# Patient Record
Sex: Female | Born: 1950 | Race: White | Hispanic: No | Marital: Married | Smoking: Never smoker
Health system: Southern US, Community
[De-identification: ages and names within clinical notes are randomized; demographics above are authoritative.]

## PROBLEM LIST (undated history)

## (undated) DIAGNOSIS — G2581 Restless legs syndrome: Secondary | ICD-10-CM

## (undated) DIAGNOSIS — K219 Gastro-esophageal reflux disease without esophagitis: Secondary | ICD-10-CM

## (undated) DIAGNOSIS — G43909 Migraine, unspecified, not intractable, without status migrainosus: Secondary | ICD-10-CM

## (undated) HISTORY — PX: ABDOMINOPLASTY: SUR9

## (undated) HISTORY — DX: Migraine, unspecified, not intractable, without status migrainosus: G43.909

## (undated) HISTORY — DX: Gastro-esophageal reflux disease without esophagitis: K21.9

## (undated) HISTORY — PX: CATARACT EXTRACTION W/ INTRAOCULAR LENS  IMPLANT, BILATERAL: SHX1307

## (undated) HISTORY — PX: KIDNEY STONE SURGERY: SHX686

## (undated) HISTORY — DX: Restless legs syndrome: G25.81

---

## 1998-10-23 ENCOUNTER — Other Ambulatory Visit: Admission: RE | Admit: 1998-10-23 | Discharge: 1998-10-23 | Payer: Self-pay | Admitting: Gynecology

## 2000-02-09 ENCOUNTER — Other Ambulatory Visit: Admission: RE | Admit: 2000-02-09 | Discharge: 2000-02-09 | Payer: Self-pay | Admitting: Gynecology

## 2007-08-20 HISTORY — PX: VAGINAL HYSTERECTOMY: SUR661

## 2007-09-01 ENCOUNTER — Ambulatory Visit (HOSPITAL_COMMUNITY): Admission: RE | Admit: 2007-09-01 | Discharge: 2007-09-02 | Payer: Self-pay | Admitting: Obstetrics and Gynecology

## 2007-09-01 ENCOUNTER — Encounter (INDEPENDENT_AMBULATORY_CARE_PROVIDER_SITE_OTHER): Payer: Self-pay | Admitting: Obstetrics and Gynecology

## 2007-11-17 HISTORY — PX: AUGMENTATION MAMMAPLASTY: SUR837

## 2008-07-02 ENCOUNTER — Encounter: Admission: RE | Admit: 2008-07-02 | Discharge: 2008-07-02 | Payer: Self-pay | Admitting: Orthopedic Surgery

## 2008-07-17 ENCOUNTER — Encounter: Admission: RE | Admit: 2008-07-17 | Discharge: 2008-07-17 | Payer: Self-pay | Admitting: Orthopedic Surgery

## 2008-08-01 ENCOUNTER — Encounter: Admission: RE | Admit: 2008-08-01 | Discharge: 2008-08-01 | Payer: Self-pay | Admitting: Orthopedic Surgery

## 2010-08-10 ENCOUNTER — Encounter: Payer: Self-pay | Admitting: Obstetrics & Gynecology

## 2010-12-01 NOTE — Discharge Summary (Signed)
[name], [name]               ACCOUNT NO.:  [id number]   MEDICAL RECORD NO.:  [id number]          PATIENT TYPE:  OIB   LOCATION:  9308                          FACILITY:  [facility]   PHYSICIAN:  Duke Salvia. Marcelle Overlie, M.D.DATE OF BIRTH:  09-26-1950   DATE OF ADMISSION:  09/01/2007  DATE OF DISCHARGE:  09/02/2007                               DISCHARGE SUMMARY   DISCHARGE DIAGNOSES:  1. Symptomatic uterine prolapse with cystocele, rectocele.  2. TVH/A&P repair this admission.   HISTORY:  For history, summary, physical exam, please see admission  history for details.  Briefly, a 60 year old with symptomatic uterine  prolapse, cystocele, rectocele presents for surgical correction.   HOSPITAL COURSE:  On February 13, under general anesthesia, the patient  underwent TVH/A&P repair.  On first postop day, she was voiding without  difficulty.  Vaginal packing had been removed.  She was tolerating a  regular diet, was afebrile, was ready for discharge at that point.   LABORATORY DATA:  Preoperative CBC:  WBC 6.2, hemoglobin 13.5,  hematocrit 38.5.  On February 14, WBC 7.3, hemoglobin 10.1, hematocrit  28.3.  blood type is A negative, antibody screen negative.   DISPOSITION:  The patient discharged on Tylox p.r.n. pain.  May resume  her other routine medications.  Return to the office in one week.  Advised to report any incisional redness or drainage, increased pain or  bleeding or fever over 101.  She was given specific instructions  regarding diet, sex, exercise.   CONDITION:  Good.   ACTIVITY:  Graded increase.      Richard M. Marcelle Overlie, M.D.  Electronically Signed     RMH/MEDQ  D:  09/02/2007  T:  09/04/2007  Job:  [id number]

## 2010-12-01 NOTE — H&P (Signed)
NAMEPAULLA, [name]               ACCOUNT NO.:  [id number]   MEDICAL RECORD NO.:  [id number]          PATIENT TYPE:  AMB   LOCATION:  SDC                           [facility]  [facility]   PHYSICIAN:  Duke Salvia. Marcelle Overlie, M.D.DATE OF BIRTH:  1951-02-06   DATE OF ADMISSION:  DATE OF DISCHARGE:                              HISTORY & PHYSICAL   CHIEF COMPLAINT:  Symptomatic pelvic prolapse.   HISTORY OF PRESENT ILLNESS:  A 60 year old G3, P2, A1 I saw originally  in May of 2008 after being taken care of by Dr. Asencion Islam for years.  At  that time, she was complaining of increased pelvic pressure, and exam  showed mild uterine prolapse with a moderate cystocele and rectocele.  She has been on HRT consisting of estradiol and progesterone compounded  on a daily basis.  Exam later in 2008, she was complaining of  dyspareunia with increased pelvic pressure, and we have discussed in  detail [facility] A&P repair.  She would prefer to conserve her otherwise  normal ovaries.  This procedure, including risks of bleeding, infection,  adjacent organ injury, possible need for open additional surgery all  reviewed with her which she understands and accepts.  Other risks  related to phlebitis, possible transfusion were discussed also.   ALLERGIES:  SULFA.   CURRENT MEDICATIONS:  Midrin, Cymbalta, Mirapex, Topamax, Flexeril,  Nexium.   PAST SURGICAL HISTORY:  1. Cesarean section x2.  2. Abdominoplasty in 1993.   FAMILY HISTORY:  Significant for father with type 2 diabetes and a  history of stroke.   PHYSICAL EXAMINATION:  VITAL SIGNS:  Temperature 98.2, blood pressure  120/72.  HEENT:  Unremarkable.  NECK:  Supple without masses.  LUNGS:  Clear.  CARDIOVASCULAR:  Regular rate and rhythm without murmurs, rubs, or  gallops.  BREASTS:  Without masses.  ABDOMEN:  Soft, flat, nontender.  PELVIC:  Normal external genitalia.  Vagina and cervix were clear.  Uterus was normal size, mobile.  Grade 1 prolapse with a  grade 2  cystocele and rectocele.  Bimanual was otherwise unremarkable.  EXTREMITIES:  Unremarkable.  NEUROLOGIC:  Unremarkable.   IMPRESSION:  Symptomatic uterine prolapse with cystocele, rectocele.   PLAN:  TVH, A&P repair.  Procedure and risks reviewed as above.      Richard M. Marcelle Overlie, M.D.  Electronically Signed     RMH/MEDQ  D:  08/12/2007  T:  08/13/2007  Job:  [id number]

## 2010-12-01 NOTE — Op Note (Signed)
[name], [name]               ACCOUNT NO.:  [id number]   MEDICAL RECORD NO.:  [id number]          PATIENT TYPE:  AMB   LOCATION:  SDC                           [facility]   PHYSICIAN:  Duke Salvia. Marcelle Overlie, M.D.DATE OF BIRTH:  Dec 11, 1950   DATE OF PROCEDURE:  09/01/2007  DATE OF DISCHARGE:                               OPERATIVE REPORT   PREOPERATIVE DIAGNOSES:  1. Symptomatic uterine prolapse.  2. Cystocele and rectocele.   POSTOPERATIVE DIAGNOSES:  1. Symptomatic uterine prolapse.  2. Cystocele and rectocele.   PROCEDURE:  1. Total vaginal hysterectomy.  2. Anterior and posterior repair.   SURGEON:  Duke Salvia. Marcelle Overlie, M.D.   ASSISTANT:  Guy Sandifer. Henderson Cloud, M.D.   ANESTHESIA:  General endotracheal.   COMPLICATIONS:  None.   DRAINS:  Foley catheter.   BLOOD LOSS:  300 mL.   SPECIMENS REMOVED:  Uterus.   PROCEDURE AND FINDINGS:  The patient taken to the operating room.  After  an adequate level of level of general endotracheal anesthesia was  obtained, the patient legs were placed in stirrups.  The perineum and  vagina were prepped and draped in the usual manner for vaginal  procedures.  Catheter was positioned, draining clear urine.  EUA carried  out.  Uterus was normal size, mobile, adnexa negative.  Speculum was  positioned.  Cervix grasped with a tenaculum.  The cervical vaginal  mucosa was incised and a posterior colpotomy performed without  difficulty.  The uterosacral ligaments on each side were then clamped,  divided, and suture ligated in Heaney fashion with #0 Vicryl suture and  held temporarily.   The mucosa was advanced superiorly.  In sequential manner, staying close  to the uterus, the cardinal ligament, uterine vasculature and upper  broad ligament pedicles were clamped with the LigaSure, coagulated, and  divided with excellent hemostasis.  This was done assuring that the  bladder was advanced to well above.  The peritoneal reflection,  anteriorly, was identified and entered sharply; and a retractor used to  gently elevate the bladder out of the field.  The remaining upper broad  ligament pedicles were coagulated and divided.  The fundus of the uterus  was then delivered posteriorly.  The remaining pedicle was clamped,  divided, and free-tied with #0 Vicryl suture.   Both ovaries were inspected and noted to be very thin and small in  appearance.  They were left in place at the patient's request.  The cuff  was then closed from 3-to-9 o'clock with a running 2-0 Vicryl suture.  This was locked.  Prior to closure sponge, needle, and instrument counts  were reported as correct x2.  The mucosa then closed right-to-left with  interrupted 2-0 Monocryl sutures.  Anterior repair was started.  At that  point, the vaginal mucosa was divided in the midline from just above the  cuff to the UV angle.  The paravesical tissue was then separated with  sharp and blunt dissection.  The paravesical connective tissue was then  plicated in the midline.  Excess vaginal mucosa was trimmed, and then  the mucosa reapproximated  with 2-0 Vicryl running sutures.  Posteriorly  a small wedge of tissue was removed from the perineum.   The posterior peritoneum was then dissected in the midline,  approximately halfway up the posterior wall.  Sharp-and-blunt dissection  was then used to free up the perirectal fascia, reducing the rectocele.  The perirectal fascia was then plicated in the midline.  A small amount  of excess vaginal mucosa was trimmed, and then reapproximated with 2-0  Vicryl sutures.  Then 3-0 Vicryl Rapide used on the perineum.  A 1-inch  taped sponge with Esterase was then used to pack the vagina although the  operative site was hemostatic at that point.  Clear urine noted  throughout.  She did receive Toradol at that point, and went to the PACU  in good condition.      Richard M. Marcelle Overlie, M.D.  Electronically Signed      RMH/MEDQ  D:  09/01/2007  T:  09/02/2007  Job:  [phone]

## 2011-04-09 LAB — CBC
HCT: 28.3 — ABNORMAL LOW
MCHC: 35.2
MCHC: 35.7
MCV: 87.4
MCV: 87.8
Platelets: 210
RDW: 12
WBC: 7.3

## 2011-04-09 LAB — TYPE AND SCREEN
ABO/RH(D): A NEG
Antibody Screen: NEGATIVE

## 2012-03-24 DIAGNOSIS — G2581 Restless legs syndrome: Secondary | ICD-10-CM | POA: Insufficient documentation

## 2012-03-24 DIAGNOSIS — G43909 Migraine, unspecified, not intractable, without status migrainosus: Secondary | ICD-10-CM | POA: Insufficient documentation

## 2012-12-04 ENCOUNTER — Other Ambulatory Visit: Payer: Self-pay | Admitting: Nurse Practitioner

## 2012-12-04 NOTE — Telephone Encounter (Signed)
eScribe request for refill on OSPHENA Last filled - samples given 08/08/12 Last AEX - 06/20/12 Next AEX - 06/22/13 Pt advised in phone note in paper chart to follow up with office in 6-8 weeks.  Please advise refills.  Chart on your shelf.  Thanks.

## 2012-12-04 NOTE — Telephone Encounter (Signed)
OK to refill if she feels this is helping - if she felt no better then I was going to recheck her. OK to refill Osphena 60 mg daily until next AEX.

## 2013-05-15 ENCOUNTER — Other Ambulatory Visit: Payer: Self-pay | Admitting: Nurse Practitioner

## 2013-05-15 NOTE — Telephone Encounter (Signed)
Annual Exam scheduled with Ulice Bold 06/22/13

## 2013-06-22 ENCOUNTER — Ambulatory Visit: Payer: Self-pay | Admitting: Nurse Practitioner

## 2013-09-25 ENCOUNTER — Encounter: Payer: Self-pay | Admitting: Nurse Practitioner

## 2013-09-26 ENCOUNTER — Ambulatory Visit (INDEPENDENT_AMBULATORY_CARE_PROVIDER_SITE_OTHER): Payer: BC Managed Care – PPO | Admitting: Nurse Practitioner

## 2013-09-26 ENCOUNTER — Encounter: Payer: Self-pay | Admitting: Nurse Practitioner

## 2013-09-26 VITALS — BP 100/62 | HR 72 | Ht 61.0 in | Wt 133.0 lb

## 2013-09-26 DIAGNOSIS — G43009 Migraine without aura, not intractable, without status migrainosus: Secondary | ICD-10-CM | POA: Insufficient documentation

## 2013-09-26 DIAGNOSIS — G2581 Restless legs syndrome: Secondary | ICD-10-CM

## 2013-09-26 DIAGNOSIS — N952 Postmenopausal atrophic vaginitis: Secondary | ICD-10-CM

## 2013-09-26 DIAGNOSIS — K219 Gastro-esophageal reflux disease without esophagitis: Secondary | ICD-10-CM

## 2013-09-26 DIAGNOSIS — Z01419 Encounter for gynecological examination (general) (routine) without abnormal findings: Secondary | ICD-10-CM

## 2013-09-26 MED ORDER — OSPEMIFENE 60 MG PO TABS: ORAL_TABLET | ORAL | Status: DC

## 2013-09-26 NOTE — Patient Instructions (Signed)

## 2013-09-26 NOTE — Progress Notes (Signed)
Patient ID: [name], female   DOB: 1950-10-27, 63 y.o.   MRN: [id number] 63 y.o. [id number] Married Caucasian Fe here for annual exam.  Married for 30 yrs.  Some vaso symptoms. Sleep is OK.   Using Osphena with good relief of vaginal dryness.  Patient's last menstrual period was 07/20/2007.          Sexually active: yes  The current method of family planning is status post hysterectomy.    Exercising: no  The patient does not participate in regular exercise at present. Smoker:  no  Health Maintenance: Pap:  06/20/12, WNL MMG:  12/25/12, Bi-Rads 1: negative Colonoscopy:  2004, repeat in 10 years due and will schedule with PCP BMD:   2011 normal TDaP:  10/14/09, PCP Labs: PCP   reports that she has never smoked. She has never used smokeless tobacco. She reports that she drinks alcohol. She reports that she does not use illicit drugs.  Past Medical History  Diagnosis Date  . GERD (gastroesophageal reflux disease)   . Migraines   . Restless leg syndrome     Past Surgical History  Procedure Laterality Date  . Cesarean section  1986, 1989    2  . Augmentation mammaplasty  11/2007  . Kidney stone surgery Bilateral 04/11, 10/13    Renal Calculi w/ Booket w/ Stent, Renal Calculi w/ Stent  . Rectocele repair      Current Outpatient Prescriptions  Medication Sig Dispense Refill  . Ascorbic Acid (VITAMIN C PO) Take by mouth.      . Calcium Carb-Cholecalciferol (CALCIUM 1000 + D PO) Take by mouth.      . eletriptan (RELPAX) 40 MG tablet Take 40 mg by mouth as needed for migraine or headache. One tablet by mouth at onset of headache. May repeat in 2 hours if headache persists or recurs.      . Multiple Vitamin (MULTIVITAMIN) tablet Take 1 tablet by mouth daily.      . OSPHENA 60 MG TABS take 1 tablet by mouth once daily  30 tablet  1  . pramipexole (MIRAPEX) 0.5 MG tablet Take 0.5 mg by mouth daily.        No current facility-administered medications for this visit.    Family History   Problem Relation Age of Onset  . Diabetes Mother   . Depression Mother   . Diabetes Father   . Hypertension Father   . Stroke Father   . Breast cancer Paternal Grandmother     ROS:  Pertinent items are noted in HPI.  Otherwise, a comprehensive ROS was negative.  Exam:   BP 100/62  Pulse 72  Ht 5\' 1"  (1.549 m)  Wt 133 lb (60.328 kg)  BMI 25.14 kg/m2  LMP 07/20/2007 Height: 5\' 1"  (154.9 cm)  Ht Readings from Last 3 Encounters:  09/26/13 5\' 1"  (1.549 m)    General appearance: alert, cooperative and appears stated age Head: Normocephalic, without obvious abnormality, atraumatic Neck: no adenopathy, supple, symmetrical, trachea midline and thyroid normal to inspection and palpation Lungs: clear to auscultation bilaterally Breasts: normal appearance, no masses or tenderness, positive findings: implants bilaterally Heart: regular rate and rhythm Abdomen: soft, non-tender; no masses,  no organomegaly Extremities: extremities normal, atraumatic, no cyanosis or edema Skin: Skin color, texture, turgor normal. No rashes or lesions Lymph nodes: Cervical, supraclavicular, and axillary nodes normal. No abnormal inguinal nodes palpated Neurologic: Grossly normal   Pelvic: External genitalia:  no lesions  Urethra:  normal appearing urethra with no masses, tenderness or lesions              Bartholin's and Skene's: normal                 Vagina: normal appearing vagina with normal color and discharge, no lesions              Cervix: absent              Pap taken: no Bimanual Exam:  Uterus:  uterus absent              Adnexa: no mass, fullness, tenderness               Rectovaginal: Confirms               Anus:  normal sphincter tone, no lesions  A:  Well Woman with normal exam  S/P TVH with A & P repair 08/2007  Atrophic vaginitis on Osphenia  History of GERD, RLS, Migraines  P:   Pap smear as per guidelines   Mammogram due 12/2013  Refill Osphenia for a year  She  will schedule colonoscopy with PCP  Counseled on breast self exam, mammography screening, adequate intake of calcium and vitamin D, diet and exercise return annually or prn  An After Visit Summary was printed and given to the patient.

## 2013-10-02 NOTE — Progress Notes (Signed)
Encounter reviewed by Dr. Lamberto Dinapoli Silva.  

## 2014-05-20 ENCOUNTER — Encounter: Payer: Self-pay | Admitting: Nurse Practitioner

## 2014-10-01 ENCOUNTER — Ambulatory Visit: Payer: Self-pay | Admitting: Nurse Practitioner

## 2014-10-10 ENCOUNTER — Ambulatory Visit: Payer: Self-pay | Admitting: Nurse Practitioner

## 2015-02-27 ENCOUNTER — Other Ambulatory Visit: Payer: Self-pay | Admitting: Obstetrics & Gynecology

## 2015-02-28 LAB — CYTOLOGY - PAP

## 2020-02-01 DIAGNOSIS — R079 Chest pain, unspecified: Secondary | ICD-10-CM | POA: Insufficient documentation

## 2020-02-01 DIAGNOSIS — R000 Tachycardia, unspecified: Secondary | ICD-10-CM | POA: Insufficient documentation

## 2020-02-01 DIAGNOSIS — R5383 Other fatigue: Secondary | ICD-10-CM | POA: Insufficient documentation

## 2020-02-01 DIAGNOSIS — I959 Hypotension, unspecified: Secondary | ICD-10-CM | POA: Insufficient documentation

## 2020-07-04 DIAGNOSIS — H43813 Vitreous degeneration, bilateral: Secondary | ICD-10-CM | POA: Insufficient documentation

## 2020-07-04 DIAGNOSIS — H52203 Unspecified astigmatism, bilateral: Secondary | ICD-10-CM | POA: Insufficient documentation

## 2020-07-04 DIAGNOSIS — H04123 Dry eye syndrome of bilateral lacrimal glands: Secondary | ICD-10-CM | POA: Insufficient documentation

## 2020-07-04 DIAGNOSIS — H02831 Dermatochalasis of right upper eyelid: Secondary | ICD-10-CM | POA: Insufficient documentation

## 2020-07-04 DIAGNOSIS — H11153 Pinguecula, bilateral: Secondary | ICD-10-CM | POA: Insufficient documentation

## 2023-11-17 ENCOUNTER — Ambulatory Visit: Admitting: Neurology

## 2023-11-17 ENCOUNTER — Encounter: Payer: Self-pay | Admitting: Neurology

## 2023-11-17 VITALS — BP 158/82 | HR 88 | Ht 61.5 in | Wt 144.0 lb

## 2023-11-17 DIAGNOSIS — G459 Transient cerebral ischemic attack, unspecified: Secondary | ICD-10-CM

## 2023-11-17 NOTE — Progress Notes (Signed)
 Chief Complaint  Patient presents with   New Patient (Initial Visit)    Pt in 14, here alone  Pt is referred for TIA. Pt states she has questions about the plaque that showed up on her MRI.       ASSESSMENT AND PLAN  [name] is a 73 y.o. female   TIA versus migraine variant in March 2025  Doing well now, vascular risk factor of aging   Keep aspirin 81 mg daily  Increase water intake  Continue follow-up with primary care physician    DIAGNOSTIC DATA (LABS, IMAGING, TESTING) - I reviewed patient records, labs, notes, testing and imaging myself where available.   MEDICAL HISTORY:  [name] is a 73 year old female, seen in request by her Primary Care at [facility] [place], [place] A, to follow up her TIA.  History is obtained from the patient and review of electronic medical records. I personally reviewed pertinent available imaging films in PACS.   PMHx of  HLD Restless leg syndrome Kidney stone  She was admitted to [facility] [place] on October 06 2023, reviewed hospital record,   Late afternoon, she was sitting reading her iPad, suddenly realized she was mildly confused, when she tried to communicate with her husband, difficulty getting the words out, was taken to local hospital, had a TIA workup, symptom lasted about 2 hours, denies headaches,  Upon admission blood pressure up to 160/77, heart rate of 99, BUN was elevated 23 with creatinine 0.63, Hemoglobin was 14.7, negative troponin,  CT head without contrast no acute abnormality  CT angiogram head and neck no large vessel occlusion, 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  Laboratory evaluation from April 2025 A1c was 5.4, lipid panel, LDL 27, normal thyroid, vitamin D 39.9    PHYSICAL EXAM:   Vitals:   11/17/23 0942 11/17/23 1000  BP: (!) 171/75 (!) 158/82  Pulse: 85 88  Weight: 144 lb (65.3 kg)   Height: 5' 1.5" (1.562 m)       Body mass index is 26.77 kg/m.  PHYSICAL EXAMNIATION:  Gen: NAD, conversant, well nourised, well groomed                     Cardiovascular: Regular rate rhythm, no peripheral edema, warm, nontender. Eyes: Conjunctivae clear without exudates or hemorrhage Neck: Supple, no carotid bruits. Pulmonary: Clear to auscultation bilaterally   NEUROLOGICAL EXAM:  MENTAL STATUS: Speech/cognition: Awake, alert, oriented to history taking and casual conversation CRANIAL NERVES: CN II: Visual fields are full to confrontation. Pupils are round equal and briskly reactive to light. CN III, IV, VI: extraocular movement are normal. No ptosis. CN V: Facial sensation is intact to light touch CN VII: Face is symmetric with normal eye closure  CN VIII: Hearing is normal to causal conversation. CN IX, X: Phonation is normal. CN XI: Head turning and shoulder shrug are intact  MOTOR: There is no pronator drift of out-stretched arms. Muscle bulk and tone are normal. Muscle strength is normal.  REFLEXES: Reflexes are 2+ and symmetric at the biceps, triceps, knees, and ankles. Plantar  responses are flexor.  SENSORY: Intact to light touch, pinprick and vibratory sensation are intact in fingers and toes.  COORDINATION: There is no trunk or limb dysmetria noted.  GAIT/STANCE: Posture is normal. Gait is steady with normal steps, base, arm swing, and turning. Heel and toe walking are normal. Tandem gait is normal.  Romberg is absent.  REVIEW OF SYSTEMS:  Full 14 system review of systems performed and notable only for as above All other review of  systems were negative.   ALLERGIES: Allergies  Allergen Reactions   Sulfa Antibiotics Rash    HOME MEDICATIONS: Current Outpatient Medications  Medication Sig Dispense Refill   Ascorbic Acid (VITAMIN C PO) Take by mouth.     aspirin EC 81 MG tablet Take 1 tablet by mouth daily.     atorvastatin (LIPITOR) 20 MG tablet TAKE 1 TABLET BY MOUTH EVERY DAY FOR 30 DAYS     Calcium Carb-Cholecalciferol (CALCIUM 1000 + D PO) Take by mouth.     Multiple Vitamin (MULTIVITAMIN) tablet Take 1 tablet by mouth daily.     NONFORMULARY OR COMPOUNDED ITEM Estradiol/testosterone pellets     pramipexole (MIRAPEX) 0.5 MG tablet Take 0.5 mg by mouth daily.      No current facility-administered medications for this visit.    PAST MEDICAL HISTORY: Past Medical History:  Diagnosis Date   GERD (gastroesophageal reflux disease)    Migraines    Restless leg syndrome     PAST SURGICAL HISTORY: Past Surgical History:  Procedure Laterality Date   ABDOMINOPLASTY  1990 ?   AUGMENTATION MAMMAPLASTY  11/2007   CATARACT EXTRACTION W/ INTRAOCULAR LENS  IMPLANT, BILATERAL Bilateral 8/13 & 9/13   CESAREAN SECTION  1986, 1989   2   KIDNEY STONE SURGERY Bilateral 04/11, 10/13   Renal Calculi w/ Booket w/ Stent, Renal Calculi w/ Stent   VAGINAL HYSTERECTOMY  08/2007   with cytocele and rectocele repair - ovaries remain  Dr. Steve El    FAMILY HISTORY: Family History  Problem Relation Age of Onset   Diabetes Mother    Depression Mother    Diabetes Father    Hypertension Father    Stroke Father    Breast cancer Paternal Grandmother    Diabetes Brother     SOCIAL HISTORY: Social History   Socioeconomic History   Marital status: Married    Spouse name: Not on file   Number of children: 2   Years of education: Not on file   Highest education level: Not on file  Occupational History   Not on file  Tobacco Use   Smoking status: Never   Smokeless tobacco: Never  Vaping Use   Vaping status: Never  Used  Substance and Sexual Activity   Alcohol use: Yes    Alcohol/week: 2.0 standard drinks of alcohol    Types: 2 Shots of liquor per week    Comment: social   Drug use: No   Sexual activity: Yes    Birth control/protection: Surgical    Comment: hysterectomy  Other Topics Concern   Not on file  Social History Narrative   Not on file   Social Drivers of Health   Financial Resource Strain: Not on file  Food Insecurity: Not on file  Transportation Needs: Not on file  Physical Activity: Not on file  Stress: Not on file  Social Connections: Not on file  Intimate Partner Violence: Not on file      Phebe Brasil, M.D. Ph.D.  [facility] [phone]  [address] [place], [place] [address] Ph: [phone] Fax: [phone]  CC:  Rommie Coats, NP [phone] [address]  [address] [place],  [place] [address]  Pcp, No
